# Patient Record
Sex: Female | Born: 1985 | Race: Black or African American | Hispanic: No | Marital: Single | Smoking: Never smoker
Health system: Southern US, Community
[De-identification: ages and names within clinical notes are randomized; demographics above are authoritative.]

## PROBLEM LIST (undated history)

## (undated) DIAGNOSIS — L80 Vitiligo: Secondary | ICD-10-CM

## (undated) DIAGNOSIS — R87619 Unspecified abnormal cytological findings in specimens from cervix uteri: Secondary | ICD-10-CM

## (undated) DIAGNOSIS — I10 Essential (primary) hypertension: Secondary | ICD-10-CM

## (undated) HISTORY — DX: Unspecified abnormal cytological findings in specimens from cervix uteri: R87.619

## (undated) HISTORY — DX: Essential (primary) hypertension: I10

---

## 2014-03-13 DIAGNOSIS — L80 Vitiligo: Secondary | ICD-10-CM | POA: Insufficient documentation

## 2016-02-16 DIAGNOSIS — I10 Essential (primary) hypertension: Secondary | ICD-10-CM | POA: Insufficient documentation

## 2016-06-19 ENCOUNTER — Emergency Department
Admission: EM | Admit: 2016-06-19 | Discharge: 2016-06-19 | Disposition: A | Discharge status: Home / Self Care | Payer: Medicaid Other | Attending: Student in an Organized Health Care Education/Training Program | Admitting: Student in an Organized Health Care Education/Training Program

## 2016-06-19 DIAGNOSIS — Y9389 Activity, other specified: Secondary | ICD-10-CM | POA: Insufficient documentation

## 2016-06-19 DIAGNOSIS — Y999 Unspecified external cause status: Secondary | ICD-10-CM | POA: Insufficient documentation

## 2016-06-19 DIAGNOSIS — M545 Low back pain, unspecified: Secondary | ICD-10-CM

## 2016-06-19 DIAGNOSIS — M546 Pain in thoracic spine: Secondary | ICD-10-CM | POA: Insufficient documentation

## 2016-06-19 DIAGNOSIS — Y92410 Unspecified street and highway as the place of occurrence of the external cause: Secondary | ICD-10-CM | POA: Insufficient documentation

## 2016-06-19 DIAGNOSIS — S3992XA Unspecified injury of lower back, initial encounter: Secondary | ICD-10-CM | POA: Diagnosis present

## 2016-06-19 MED ORDER — NAPROXEN 500 MG PO TABS: 500.0000 mg | ORAL_TABLET | Freq: Two times a day (BID) | ORAL | 0 refills | Status: DC

## 2016-06-19 MED ORDER — CYCLOBENZAPRINE HCL 10 MG PO TABS: 10.0000 mg | ORAL_TABLET | Freq: Three times a day (TID) | ORAL | 0 refills | Status: DC | PRN
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
## 2016-06-19 NOTE — Discharge Instructions (Signed)

## 2016-06-19 NOTE — ED Notes (Signed)

## 2016-10-01 ENCOUNTER — Emergency Department: Payer: Medicaid Other

## 2016-10-01 ENCOUNTER — Emergency Department
Admission: EM | Admit: 2016-10-01 | Discharge: 2016-10-01 | Disposition: A | Discharge status: Home / Self Care | Payer: Medicaid Other | Attending: Emergency Medicine | Admitting: Emergency Medicine

## 2016-10-01 ENCOUNTER — Encounter: Payer: Self-pay | Admitting: Emergency Medicine

## 2016-10-01 DIAGNOSIS — Y929 Unspecified place or not applicable: Secondary | ICD-10-CM | POA: Insufficient documentation

## 2016-10-01 DIAGNOSIS — S62663A Nondisplaced fracture of distal phalanx of left middle finger, initial encounter for closed fracture: Secondary | ICD-10-CM | POA: Diagnosis not present

## 2016-10-01 DIAGNOSIS — S6982XA Other specified injuries of left wrist, hand and finger(s), initial encounter: Secondary | ICD-10-CM | POA: Diagnosis present

## 2016-10-01 DIAGNOSIS — W182XXA Fall in (into) shower or empty bathtub, initial encounter: Secondary | ICD-10-CM | POA: Diagnosis not present

## 2016-10-01 DIAGNOSIS — Y939 Activity, unspecified: Secondary | ICD-10-CM | POA: Insufficient documentation

## 2016-10-01 DIAGNOSIS — S60222A Contusion of left hand, initial encounter: Secondary | ICD-10-CM

## 2016-10-01 DIAGNOSIS — R030 Elevated blood-pressure reading, without diagnosis of hypertension: Secondary | ICD-10-CM | POA: Diagnosis not present

## 2016-10-01 DIAGNOSIS — Y999 Unspecified external cause status: Secondary | ICD-10-CM | POA: Diagnosis not present

## 2016-10-01 MED ORDER — ACETAMINOPHEN 325 MG PO TABS
650.0000 mg | ORAL_TABLET | Freq: Once | ORAL | Status: AC
  Administered 2016-10-01: 650 mg via ORAL
  Filled 2016-10-01: qty 2

## 2016-10-01 MED ORDER — NAPROXEN 500 MG PO TBEC: 500.0000 mg | DELAYED_RELEASE_TABLET | Freq: Two times a day (BID) | ORAL | 0 refills | Status: DC

## 2016-10-01 MED ORDER — HYDROCHLOROTHIAZIDE 12.5 MG PO CAPS: 12.5000 mg | ORAL_CAPSULE | ORAL | 0 refills | Status: DC

## 2016-10-01 NOTE — ED Triage Notes (Signed)
Patient to ER for left hand pain after fall in shower yesterday. Patient's hand swollen, no obvious deformity.

## 2016-10-01 NOTE — ED Provider Notes (Signed)
[facility] Emergency Department Provider Note ____________________________________________  Time seen: 0739  I have reviewed the triage vital signs and the nursing notes.  HISTORY  Chief Complaint  Hand Injury  History collected by J. Irean Hong, PA-S [place])  HPI Leslie Jones is a 31 y.o. female presents to the ED for evaluation of left hand and middle finger pain s/p a mechanical fall in the shower yesterday. She recalls falling backwards with an outstretched hand. She hit her hand and fingers on a metal grab bar. She presents now with pain and swelling to the distal middle fingertip, dorsal hand, and tenderness over the index finger. She denies head injury, LOC, or laceration. She has not attempted any alleviating measures prior to arrival. She reports a current tetanus.   History reviewed. No pertinent past medical history.  There are no active problems to display for this patient.   Past Surgical History:  Procedure Laterality Date  . CESAREAN SECTION      Prior to Admission medications   Medication Sig Start Date End Date Taking? Authorizing Provider  cyclobenzaprine (FLEXERIL) 10 MG tablet Take 1 tablet (10 mg total) by mouth 3 (three) times daily as needed for muscle spasms. 06/19/16   Willy Eddy, MD  hydrochlorothiazide (MICROZIDE) 12.5 MG capsule Take 1 capsule (12.5 mg total) by mouth every morning. 10/01/16 10/31/16  Atanacio Melnyk, Charlesetta Ivory, PA-C  naproxen (EC NAPROSYN) 500 MG EC tablet Take 1 tablet (500 mg total) by mouth 2 (two) times daily with a meal. 10/01/16   Edelmira Gallogly, Charlesetta Ivory, PA-C    Allergies Patient has no known allergies.  No family history on file.  Social History Social History  Substance Use Topics  . Smoking status: Never Smoker  . Smokeless tobacco: Never Used  . Alcohol use No    Review of Systems  Constitutional: Negative for fever. Cardiovascular: Negative for chest pain. Respiratory: Negative for  shortness of breath. Musculoskeletal: Negative for back pain. Left hand pain as above. Skin: Negative for rash. Neurological: Negative for headaches, focal weakness or numbness. ____________________________________________  PHYSICAL EXAM:  VITAL SIGNS: ED Triage Vitals [10/01/16 0729]  Enc Vitals Group     BP (!) 153/100     Pulse Rate 85     Resp 16     Temp 98.6 F (37 C)     Temp Source Oral     SpO2 97 %     Weight 160 lb (72.6 kg)     Height 5\' 2"  (1.575 m)     Head Circumference      Peak Flow      Pain Score 8     Pain Loc      Pain Edu?      Excl. in GC?     Constitutional: Alert and oriented. Well appearing and in no distress. Head: Normocephalic and atraumatic. Cardiovascular: Normal rate, regular rhythm. Normal distal pulses. Respiratory: Normal respiratory effort. No wheezes/rales/rhonchi. Musculoskeletal: Left hand with obvious dorsal STS. Left middle finger with a small subungual hematoma noted at the cuticle. Some subtle edema of the middle finger distal phalanx. Normal composite fist. Nontender with normal range of motion in all extremities.  Neurologic:  Normal gross sensation. Normal intrinsic & opposition testing. CN II-XII grossly intact. Normal speech and language. No gross focal neurologic deficits are appreciated. Skin:  Skin is warm, dry and intact. No rash noted. Scattered superficial abrasions to the dorsal left hand. ____________________________________________   RADIOLOGY  Left Hand IMPRESSION: Third  distal tuft fracture.  I, Leonid Manus, Charlesetta IvoryJenise V Bacon, personally viewed and evaluated these images (plain radiographs) as part of my medical decision making, as well as reviewing the written report by the radiologist. ____________________________________________  PROCEDURES  Finger splint - middle finger Buddy tape Tylenol 650 mg PO ____________________________________________  INITIAL IMPRESSION / ASSESSMENT AND PLAN / ED COURSE  Patient  with initial fracture management of a left hand contusion resulting in a closed, distal tuft fracture of the distal phalanx of the middle finger. Patient is appropriately splinted and given fracture care instructions. She will be referred to ortho-hand for further care. Work activities limited by finger splint. She should also see her PCP or a local community clinic for further management of her elevated blood pressures. I will start her on a low-dose HCTZ ahead of her July 2nd [facility] visit.  ____________________________________________  FINAL CLINICAL IMPRESSION(S) / ED DIAGNOSES  Final diagnoses:  Contusion of left hand, initial encounter  Nondisplaced fracture of distal phalanx of left middle finger, initial encounter for closed fracture  Elevated BP without diagnosis of hypertension     Myrah Strawderman, Charlesetta IvoryJenise V Bacon, PA-C 10/01/16 0839    Lissa HoardMenshew, Rodell Marrs V Bacon, PA-C 10/01/16 [id number]    Jeanmarie PlantMcShane, James A, MD 10/01/16 1529

## 2016-10-01 NOTE — Discharge Instructions (Signed)
You are being treated for a fracture (break) of the distal finger tip bone. You also have some swelling from your injury. Wear the finger splint for protection. Take the Naproxen for pain and inflammation. Apply ice to reduce swelling. Follow-up with ortho for continued symptoms. Monitor and track your blood pressures weekly, because you may need treatment for hypertension. Avoid high sodium (salty) foods, processed meats, and fast food. Drink plenty of water and green tea.

## 2016-10-01 NOTE — ED Notes (Signed)
Patient presents to the ED with left hand pain post falling in the shower at 4:30pm yesterday.  Patient's middle finger appears bruised.  Patient is not wanting to move her left hand much but sensation is intact.

## 2016-12-17 DIAGNOSIS — R300 Dysuria: Secondary | ICD-10-CM | POA: Diagnosis present

## 2016-12-17 DIAGNOSIS — Z79899 Other long term (current) drug therapy: Secondary | ICD-10-CM | POA: Insufficient documentation

## 2016-12-17 DIAGNOSIS — R6883 Chills (without fever): Secondary | ICD-10-CM | POA: Insufficient documentation

## 2016-12-17 DIAGNOSIS — N12 Tubulo-interstitial nephritis, not specified as acute or chronic: Secondary | ICD-10-CM | POA: Diagnosis not present

## 2016-12-17 LAB — COMPREHENSIVE METABOLIC PANEL
ALT: 29 U/L (ref 14–54)
AST: 40 U/L (ref 15–41)
Albumin: 3.9 g/dL (ref 3.5–5.0)
Alkaline Phosphatase: 68 U/L (ref 38–126)
Anion gap: 6 (ref 5–15)
BILIRUBIN TOTAL: 1.4 mg/dL — AB (ref 0.3–1.2)
BUN: 12 mg/dL (ref 6–20)
CHLORIDE: 107 mmol/L (ref 101–111)
CO2: 25 mmol/L (ref 22–32)
CREATININE: 1.1 mg/dL — AB (ref 0.44–1.00)
Calcium: 9.1 mg/dL (ref 8.9–10.3)
Glucose, Bld: 137 mg/dL — ABNORMAL HIGH (ref 65–99)
Potassium: 3.5 mmol/L (ref 3.5–5.1)
Sodium: 138 mmol/L (ref 135–145)
TOTAL PROTEIN: 7.4 g/dL (ref 6.5–8.1)

## 2016-12-17 LAB — CBC WITH DIFFERENTIAL/PLATELET
BASOS ABS: 0 10*3/uL (ref 0–0.1)
Basophils Relative: 0 %
EOS PCT: 1 %
Eosinophils Absolute: 0 10*3/uL (ref 0–0.7)
HEMATOCRIT: 37 % (ref 35.0–47.0)
Hemoglobin: 13.3 g/dL (ref 12.0–16.0)
LYMPHS ABS: 0.8 10*3/uL — AB (ref 1.0–3.6)
LYMPHS PCT: 15 %
MCH: 32.1 pg (ref 26.0–34.0)
MCHC: 35.9 g/dL (ref 32.0–36.0)
MCV: 89.4 fL (ref 80.0–100.0)
MONO ABS: 0.2 10*3/uL (ref 0.2–0.9)
Monocytes Relative: 4 %
NEUTROS ABS: 4.4 10*3/uL (ref 1.4–6.5)
Neutrophils Relative %: 80 %
PLATELETS: 194 10*3/uL (ref 150–440)
RBC: 4.14 MIL/uL (ref 3.80–5.20)
RDW: 12.2 % (ref 11.5–14.5)
WBC: 5.5 10*3/uL (ref 3.6–11.0)

## 2016-12-17 LAB — URINALYSIS, ROUTINE W REFLEX MICROSCOPIC
Bilirubin Urine: NEGATIVE
GLUCOSE, UA: NEGATIVE mg/dL
Ketones, ur: NEGATIVE mg/dL
NITRITE: NEGATIVE
PH: 6 (ref 5.0–8.0)
Protein, ur: 30 mg/dL — AB
SPECIFIC GRAVITY, URINE: 1.017 (ref 1.005–1.030)

## 2016-12-17 NOTE — ED Triage Notes (Signed)
Patient reports last week with dysuria.  Now with generalized weakness and chills.

## 2016-12-17 NOTE — ED Notes (Signed)
POCT RESULTS were *(NEGATIVE)*. Urine sample has been sent to the lab

## 2016-12-18 ENCOUNTER — Emergency Department
Admission: EM | Admit: 2016-12-18 | Discharge: 2016-12-18 | Disposition: A | Discharge status: Home / Self Care | Payer: Medicaid Other | Attending: Emergency Medicine | Admitting: Emergency Medicine

## 2016-12-18 DIAGNOSIS — N12 Tubulo-interstitial nephritis, not specified as acute or chronic: Secondary | ICD-10-CM

## 2016-12-18 MED ORDER — CIPROFLOXACIN HCL 500 MG PO TABS: 500.0000 mg | ORAL_TABLET | Freq: Two times a day (BID) | ORAL | 0 refills | Status: AC

## 2016-12-18 MED ORDER — CIPROFLOXACIN HCL 500 MG PO TABS
500.0000 mg | ORAL_TABLET | Freq: Once | ORAL | Status: AC
  Administered 2016-12-18: 500 mg via ORAL
  Filled 2016-12-18: qty 1

## 2016-12-18 MED ORDER — IBUPROFEN 600 MG PO TABS
600.0000 mg | ORAL_TABLET | Freq: Once | ORAL | Status: AC
  Administered 2016-12-18: 600 mg via ORAL
  Filled 2016-12-18: qty 1

## 2016-12-18 NOTE — ED Provider Notes (Signed)
[facility] Emergency Department Provider Note _   First MD Initiated Contact with Patient 12/18/16 0126     (approximate)  I have reviewed the triage vital signs and the nursing notes.   HISTORY  Chief Complaint Chills and Dysuria   HPI Leslie Jones is a 31 y.o. female presents to the emergency department with one-week history of dysuria with onset of generalized weakness chills and 8 out of 10 left side back pain tonight. Patient denies any nausea or vomiting. Patient denies any abdominal pain. Patient denies any hematuria. Patient denies any fever noted to have an oral  temperature 100.1 on arrival to the emergency part.   Past medical history None There are no active problems to display for this patient.   Past Surgical History:  Procedure Laterality Date  . CESAREAN SECTION      Prior to Admission medications   Medication Sig Start Date End Date Taking? Authorizing Provider  cyclobenzaprine (FLEXERIL) 10 MG tablet Take 1 tablet (10 mg total) by mouth 3 (three) times daily as needed for muscle spasms. 06/19/16   Willy Eddy, MD  hydrochlorothiazide (MICROZIDE) 12.5 MG capsule Take 1 capsule (12.5 mg total) by mouth every morning. 10/01/16 10/31/16  Menshew, Charlesetta Ivory, PA-C  naproxen (EC NAPROSYN) 500 MG EC tablet Take 1 tablet (500 mg total) by mouth 2 (two) times daily with a meal. 10/01/16   Menshew, Charlesetta Ivory, PA-C    Allergies No known drug allergies No family history on file.  Social History Social History  Substance Use Topics  . Smoking status: Never Smoker  . Smokeless tobacco: Never Used  . Alcohol use No    Review of Systems Constitutional: No fever/chills Eyes: No visual changes. ENT: No sore throat. Cardiovascular: Denies chest pain. Respiratory: Denies shortness of breath. Gastrointestinal: No abdominal pain.  No nausea, no vomiting.  No diarrhea.  No constipation. Genitourinary: Negative for  dysuria. Musculoskeletal: Negative for neck pain.  Negative for back pain. Integumentary: Negative for rash. Neurological: Negative for headaches, focal weakness or numbness.   ____________________________________________   PHYSICAL EXAM:  VITAL SIGNS: ED Triage Vitals  Enc Vitals Group     BP 12/17/16 2228 (!) 175/103     Pulse Rate 12/17/16 2228 98     Resp 12/17/16 2228 20     Temp 12/17/16 2228 100.1 F (37.8 C)     Temp Source 12/17/16 2228 Oral     SpO2 12/17/16 2228 99 %     Weight 12/17/16 2228 72.6 kg (160 lb)     Height 12/17/16 2228 1.575 m ( )     Head Circumference --      Peak Flow --      Pain Score 12/18/16 0122 8     Pain Loc --      Pain Edu? --      Excl. in GC? --     Constitutional: Alert and oriented. Well appearing and in no acute distress. Eyes: Conjunctivae are normal.  Head: Atraumatic. Mouth/Throat: Mucous membranes are moist.  Oropharynx non-erythematous. Neck: No stridor.   Cardiovascular: Normal rate, regular rhythm. Good peripheral circulation. Grossly normal heart sounds. Respiratory: Normal respiratory effort.  No retractions. Lungs CTAB. Gastrointestinal: Soft and nontender. No distention. LEFT cva TENDERNESS Musculoskeletal: No lower extremity tenderness nor edema. No gross deformities of extremities. Neurologic:  Normal speech and language. No gross focal neurologic deficits are appreciated.  Skin:  Skin is warm, dry and intact. No rash noted.  Psychiatric: Mood and affect are normal. Speech and behavior are normal.  ____________________________________________   LABS (all labs ordered are listed, but only abnormal results are displayed)  Labs Reviewed  CBC WITH DIFFERENTIAL/PLATELET - Abnormal; Notable for the following:       Result Value   Lymphs Abs 0.8 (*)    All other components within normal limits  COMPREHENSIVE METABOLIC PANEL - Abnormal; Notable for the following:    Glucose, Bld 137 (*)    Creatinine, Ser 1.10  (*)    Total Bilirubin 1.4 (*)    All other components within normal limits  URINALYSIS, ROUTINE W REFLEX MICROSCOPIC - Abnormal; Notable for the following:    Color, Urine YELLOW (*)    APPearance CLOUDY (*)    Hgb urine dipstick MODERATE (*)    Protein, ur 30 (*)    Leukocytes, UA LARGE (*)    Bacteria, UA RARE (*)    Squamous Epithelial / LPF 0-5 (*)    All other components within normal limits  POC URINE PREG, ED     Procedures   ____________________________________________   INITIAL IMPRESSION / ASSESSMENT AND PLAN / ED COURSE  Pertinent labs & imaging results that were available during my care of the patient were reviewed by me and considered in my medical decision making (see chart for details).  31-YEAR-OLD FEMALE PRESENTING WITH HISTORY OF PHYSICAL EXAM CONSISTENT WITH A ACUTE PYELONEPHRITIS. PATIENT GIVEN CIPRO IN THE EMERGENCY DEPARTMENT WILL BE PRESCRIBED THE SAME FOR HOME. DISCUSSED WITH THE PATIENT AT LENGTH WARNING SIGNS THAT WOULD WARRANT RETURN TO THE EMERGENCY DEPARTMENT.   Clinical Course as of Dec 19 155  Wynelle LinkSun Dec 18, 2016  0105 Glucose: NEGATIVE [RB]    Clinical Course User Index [RB] Darci CurrentBrown, Canfield N, MD    ____________________________________________  FINAL CLINICAL IMPRESSION(S) / ED DIAGNOSES  Final diagnoses:  Pyelonephritis     MEDICATIONS GIVEN DURING THIS VISIT:  Medications  ciprofloxacin (CIPRO) tablet 500 mg (500 mg Oral Given 12/18/16 0143)  ibuprofen (ADVIL,MOTRIN) tablet 600 mg (600 mg Oral Given 12/18/16 0143)     NEW OUTPATIENT MEDICATIONS STARTED DURING THIS VISIT:  New Prescriptions   No medications on file    Modified Medications   No medications on file    Discontinued Medications   No medications on file     Note:  This document was prepared using Dragon voice recognition software and may include unintentional dictation errors.    Darci CurrentBrown,  N, MD 12/18/16 [phone]

## 2016-12-18 NOTE — ED Notes (Signed)
Dr. Brown at bedside

## 2016-12-18 NOTE — ED Notes (Signed)
Pt reports aching all over since last Sunday; sharp pain in vaginal area when voiding but no burning; urinary frequency in small amounts; chills at times, has not checked temp at home; cramping across lower abd and across lower back;

## 2017-09-05 LAB — HM PAP SMEAR: HM Pap smear: POSITIVE

## 2017-09-26 ENCOUNTER — Ambulatory Visit (INDEPENDENT_AMBULATORY_CARE_PROVIDER_SITE_OTHER): Payer: Medicaid Other | Admitting: Obstetrics and Gynecology

## 2017-09-26 ENCOUNTER — Encounter: Payer: Self-pay | Admitting: Obstetrics and Gynecology

## 2017-09-26 VITALS — BP 162/94 | HR 87 | Ht 62.0 in | Wt 182.8 lb

## 2017-09-26 DIAGNOSIS — B977 Papillomavirus as the cause of diseases classified elsewhere: Secondary | ICD-10-CM

## 2017-09-26 DIAGNOSIS — R87612 Low grade squamous intraepithelial lesion on cytologic smear of cervix (LGSIL): Secondary | ICD-10-CM

## 2017-09-26 DIAGNOSIS — N72 Inflammatory disease of cervix uteri: Secondary | ICD-10-CM

## 2017-09-26 NOTE — Addendum Note (Signed)
Addended by: Marchelle FolksMILLER, Jobeth Pangilinan G on: 09/26/2017 03:39 PM   Modules accepted: Orders

## 2017-09-26 NOTE — Progress Notes (Signed)
HPI:  Leslie Jones is a 32 y.o.  [phone]  who presents today for evaluation and management of abnormal cervical cytology.  This is her first abnormal Pap smear  Dysplasia History: LSIL    positive high risk viral types  ROS:  Pertinent items are noted in HPI.  OB History  Gravida Para Term Preterm AB Living  3 3 3     3   SAB TAB Ectopic Multiple Live Births          3    # Outcome Date GA Lbr Len/2nd Weight Sex Delivery Anes PTL Lv  3 Term 2015   6 lb 1.9 oz (2.776 kg) M Vag-Spont   LIV  2 Term 2012   7 lb 2.4 oz (3.243 kg) F Vag-Spont   LIV  1 Term 2009   7 lb 2.4 oz (3.243 kg) M CS-LTranv   LIV    Past Medical History:  Diagnosis Date  . Abnormal Pap smear of cervix   . Hypertension     Past Surgical History:  Procedure Laterality Date  . CESAREAN SECTION      SOCIAL HISTORY: Social History   Substance and Sexual Activity  Alcohol Use No   Social History   Substance and Sexual Activity  Drug Use No     Family History  Problem Relation Age of Onset  . Heart failure Maternal Aunt   . Breast cancer Maternal Grandmother   . Ovarian cancer Neg Hx   . Colon cancer Neg Hx     ALLERGIES:  Patient has no known allergies.  She has a current medication list which includes the following prescription(s): medroxyprogesterone.  Physical Exam: -Vitals:  BP (!) 162/94   Pulse 87   Ht 5\' 2"  (1.575 m)   Wt 182 lb 12.8 oz (82.9 kg)   LMP  (LMP Unknown)   BMI 33.43 kg/m  GEN: WD, WN, NAD.  A+ O x 3, good mood and affect. ABD:  NT, ND.  Soft, no masses.  No hernias noted.   Pelvic:   Vulva: Normal appearance.  No lesions.  Vagina: No lesions or abnormalities noted.  Support: Normal pelvic support.  Urethra No masses tenderness or scarring.  Meatus Normal size without lesions or prolapse.  Cervix: See below.  Anus: Normal exam.  No lesions.  Perineum: Normal exam.  No lesions.        Bimanual   Uterus: Normal size.  Non-tender.  Mobile.  AV.  Adnexae: No  masses.  Non-tender to palpation.  Cul-de-sac: Negative for abnormality.   PROCEDURE: 1.  Urine Pregnancy Test:  negative 2.  Colposcopy performed with 4% acetic acid after verbal consent obtained                           -Aceto-white Lesions Location(s): 4 o'clock.              -Biopsy performed at 4 o'clock               -ECC indicated and performed: No.     -Biopsy sites made hemostatic with pressure and Monsel's solution   -Satisfactory colposcopy: Yes.      -Evidence of Invasive cervical CA :  NO  ASSESSMENT:  Leslie SpraySheara Budnick is a 32 y.o. [id number] here for  1. Low grade squamous intraepithelial lesion on cytologic smear of cervix (LGSIL)   2. High risk human papilloma virus (HPV) infection of cervix   .  PLAN: 1.  I discussed the grading system of pap smears and HPV high risk viral types.  We will discuss management after colpo results return.  No orders of the defined types were placed in this encounter.          F/U  No follow-ups on file.  Brennan Bailey ,MD 09/26/2017,2:32 PM

## 2017-09-28 LAB — PATHOLOGY

## 2017-10-03 ENCOUNTER — Ambulatory Visit (INDEPENDENT_AMBULATORY_CARE_PROVIDER_SITE_OTHER): Payer: Medicaid Other | Admitting: Obstetrics and Gynecology

## 2017-10-03 ENCOUNTER — Encounter: Payer: Self-pay | Admitting: Obstetrics and Gynecology

## 2017-10-03 VITALS — BP 142/94 | HR 75 | Ht 62.0 in | Wt 181.7 lb

## 2017-10-03 DIAGNOSIS — N72 Inflammatory disease of cervix uteri: Secondary | ICD-10-CM | POA: Diagnosis not present

## 2017-10-03 DIAGNOSIS — B977 Papillomavirus as the cause of diseases classified elsewhere: Secondary | ICD-10-CM | POA: Diagnosis not present

## 2017-10-03 DIAGNOSIS — R87612 Low grade squamous intraepithelial lesion on cytologic smear of cervix (LGSIL): Secondary | ICD-10-CM | POA: Diagnosis not present

## 2017-10-03 NOTE — Progress Notes (Signed)
Pt present today for colpo results. Pt stated that she is doing well.

## 2017-10-03 NOTE — Progress Notes (Signed)
HPI:      Ms. Katianne Barre is a 32 y.o. [phone] who LMP was No LMP recorded (lmp unknown). Patient has had an injection.  Subjective:   She presents today for follow-up colposcopy.    Hx: The following portions of the patient's history were reviewed and updated as appropriate:             She  has a past medical history of Abnormal Pap smear of cervix and Hypertension. She does not have a problem list on file. She  has a past surgical history that includes Cesarean section. Her family history includes Breast cancer in her maternal grandmother; Heart failure in her maternal aunt; Seizures in her mother. She  reports that she has never smoked. She has never used smokeless tobacco. She reports that she does not drink alcohol or use drugs. She has a current medication list which includes the following prescription(s): medroxyprogesterone. She has No Known Allergies.       Review of Systems:  Review of Systems  Constitutional: Denied constitutional symptoms, night sweats, recent illness, fatigue, fever, insomnia and weight loss.  Eyes: Denied eye symptoms, eye pain, photophobia, vision change and visual disturbance.  Ears/Nose/Throat/Neck: Denied ear, nose, throat or neck symptoms, hearing loss, nasal discharge, sinus congestion and sore throat.  Cardiovascular: Denied cardiovascular symptoms, arrhythmia, chest pain/pressure, edema, exercise intolerance, orthopnea and palpitations.  Respiratory: Denied pulmonary symptoms, asthma, pleuritic pain, productive sputum, cough, dyspnea and wheezing.  Gastrointestinal: Denied, gastro-esophageal reflux, melena, nausea and vomiting.  Genitourinary: Denied genitourinary symptoms including symptomatic vaginal discharge, pelvic relaxation issues, and urinary complaints.  Musculoskeletal: Denied musculoskeletal symptoms, stiffness, swelling, muscle weakness and myalgia.  Dermatologic: Denied dermatology symptoms, rash and scar.  Neurologic: Denied  neurology symptoms, dizziness, headache, neck pain and syncope.  Psychiatric: Denied psychiatric symptoms, anxiety and depression.  Endocrine: Denied endocrine symptoms including hot flashes and night sweats.   Meds:   Current Outpatient Medications on File Prior to Visit  Medication Sig Dispense Refill  . medroxyPROGESTERone (DEPO-SUBQ PROVERA 104) 104 MG/0.65ML injection Inject into the skin.     No current facility-administered medications on file prior to visit.     Objective:     Vitals:   10/03/17 1513  BP: (!) 142/94  Pulse: 75                Assessment:    [id number] There are no active problems to display for this patient.    1. Low grade squamous intraepithelial lesion on cytologic smear of cervix (LGSIL)   2. High risk human papilloma virus (HPV) infection of cervix     Colposcopy results revealed normal cytology.   Plan:            1.  I discussed normal cytology and abnormal Pap smears in detail with the patient.  HPV was also again reviewed.  I have recommended a follow-up Pap smear in 1 year.  Based on this Pap would consider colposcopy as needed.  Importance of follow-up and HPV discussed in detail. Orders No orders of the defined types were placed in this encounter.   No orders of the defined types were placed in this encounter.     F/U  Return in about 1 year (around 10/04/2018). I spent 16 minutes involved in the care of this patient of which greater than 50% was spent discussing abnormal colposcopy, normal colposcopy, abnormal Pap smears, cytology versus HPV, expected follow-up and consequences of future abnormal Pap smears if they occur.  All questions answered.  Elonda Huskyavid J. Evans, M.D. 10/03/2017 5:28 PM

## 2017-12-12 ENCOUNTER — Emergency Department
Admission: EM | Admit: 2017-12-12 | Discharge: 2017-12-12 | Disposition: A | Discharge status: Home / Self Care | Payer: Medicaid Other | Attending: Emergency Medicine | Admitting: Emergency Medicine

## 2017-12-12 ENCOUNTER — Other Ambulatory Visit: Payer: Self-pay

## 2017-12-12 DIAGNOSIS — Z79899 Other long term (current) drug therapy: Secondary | ICD-10-CM | POA: Diagnosis not present

## 2017-12-12 DIAGNOSIS — G43909 Migraine, unspecified, not intractable, without status migrainosus: Secondary | ICD-10-CM | POA: Diagnosis not present

## 2017-12-12 DIAGNOSIS — I10 Essential (primary) hypertension: Secondary | ICD-10-CM | POA: Diagnosis not present

## 2017-12-12 MED ORDER — BUTALBITAL-APAP-CAFFEINE 50-325-40 MG PO TABS: 1.0000 | ORAL_TABLET | Freq: Four times a day (QID) | ORAL | 0 refills | Status: AC | PRN

## 2017-12-12 MED ORDER — KETOROLAC TROMETHAMINE 30 MG/ML IJ SOLN
30.0000 mg | Freq: Once | INTRAMUSCULAR | Status: AC
  Administered 2017-12-12: 30 mg via INTRAMUSCULAR
  Filled 2017-12-12: qty 1

## 2017-12-12 NOTE — ED Notes (Signed)
ED Provider at bedside. 

## 2017-12-12 NOTE — ED Notes (Signed)
Pt verbalized understanding of discharge instructions. NAD at this time. 

## 2017-12-12 NOTE — ED Notes (Signed)
Pt resting quietly on stretcher reports migraine HA x 2 days with photosensitivity and nausea.  Lights dimmed and pt given warm blanket.

## 2017-12-12 NOTE — ED Provider Notes (Signed)
[facility] Emergency Department Provider Note   ____________________________________________    I have reviewed the triage vital signs and the nursing notes.   HISTORY  Chief Complaint Migraine     HPI Leslie Jones is a 32 y.o. female who presents with complaints of a migraine headache.  Patient reports she has had a symptoms over the last 2 days.  She describes a throbbing pain behind her left eye.  She reports this is typical of her migraines.  She was taking BC powder and this was helping somewhat but she was having to take more than typical.  Denies neuro deficits.  No back pain neck pain fevers or chills.  No nausea or vomiting.  Decreased p.o. appetite.  On Depo-Provera   Past Medical History:  Diagnosis Date  . Abnormal Pap smear of cervix   . Hypertension     There are no active problems to display for this patient.   Past Surgical History:  Procedure Laterality Date  . CESAREAN SECTION      Prior to Admission medications   Medication Sig Start Date End Date Taking? Authorizing Provider  butalbital-acetaminophen-caffeine (FIORICET, ESGIC) 50-325-40 MG tablet Take 1-2 tablets by mouth every 6 (six) hours as needed for headache. 12/12/17 12/12/18  Jene Every, MD  medroxyPROGESTERone (DEPO-SUBQ PROVERA 104) 104 MG/0.65ML injection Inject into the skin.    [provider]     Allergies Patient has no known allergies.  Family History  Problem Relation Age of Onset  . Heart failure Maternal Aunt   . Breast cancer Maternal Grandmother   . Seizures Mother   . Ovarian cancer Neg Hx   . Colon cancer Neg Hx     Social History Social History   Tobacco Use  . Smoking status: Never Smoker  . Smokeless tobacco: Never Used  Substance Use Topics  . Alcohol use: No  . Drug use: No    Review of Systems  Constitutional: No fever/chills Eyes: No changes in vision ENT: No neck pain Cardiovascular: No  palpitation Respiratory: No cough Gastrointestinal:   No nausea, no vomiting.   Genitourinary: Negative for incontinence Musculoskeletal: Negative for neck pain Skin: Negative for rash. Neurological: Headache as above, no weakness or paresthesias   ____________________________________________   PHYSICAL EXAM:  VITAL SIGNS: ED Triage Vitals  Enc Vitals Group     BP 12/12/17 0814 (!) 175/109     Pulse Rate 12/12/17 0814 87     Resp 12/12/17 0814 18     Temp 12/12/17 0814 98.3 F (36.8 C)     Temp Source 12/12/17 0814 Oral     SpO2 12/12/17 0814 100 %     Weight 12/12/17 0815 81.6 kg (180 lb)     Height 12/12/17 0815 1.575 m (5\' 2" )     Head Circumference --      Peak Flow --      Pain Score 12/12/17 0815 7     Pain Loc --      Pain Edu? --      Excl. in GC? --     Constitutional: Alert and oriented.  Eyes: Conjunctivae are normal.  PERRLA, EOMI Head: Atraumatic. Mouth/Throat: Mucous membranes are moist.   Cardiovascular: Normal rate, regular rhythm. Good peripheral circulation. Respiratory: Normal respiratory effort.  No retractions. Gastrointestinal: Soft and nontender. No distention.  No CVA tenderness. Genitourinary: deferred Musculoskeletal: No lower extremity tenderness nor edema.  Warm and well perfused Neurologic:  Normal speech and language. No  gross focal neurologic deficits are appreciated.  Renal nerves II through XII are normal Skin:  Skin is warm, dry and intact. No rash noted. Psychiatric: Mood and affect are normal. Speech and behavior are normal.  ____________________________________________   LABS (all labs ordered are listed, but only abnormal results are displayed)  Labs Reviewed - No data to display ____________________________________________  EKG  None ____________________________________________  RADIOLOGY  None ____________________________________________   PROCEDURES  Procedure(s) performed: No  Procedures   Critical Care  performed: No ____________________________________________   INITIAL IMPRESSION / ASSESSMENT AND PLAN / ED COURSE  Pertinent labs & imaging results that were available during my care of the patient were reviewed by me and considered in my medical decision making (see chart for details).  Patient well-appearing in no acute distress.  Blood pressure is elevated, she does have a history of high blood pressure.  Discussed treatment options with patient, she would prefer an IM injection and prescription so that she can go home and rest in her own bed.  I think this is quite reasonable, I did offer IV medications but she declined    ____________________________________________   FINAL CLINICAL IMPRESSION(S) / ED DIAGNOSES  Final diagnoses:  Migraine without status migrainosus, not intractable, unspecified migraine type        Note:  This document was prepared using Dragon voice recognition software and may include unintentional dictation errors.    Jene Every, MD 12/12/17 1026

## 2017-12-12 NOTE — ED Triage Notes (Signed)
Pt c/o migraine for the past 2 days with nausea. Pt is hypertensive in triage with a hx , states she does not take anything for HTN.Leslie Jones

## 2018-02-13 IMAGING — DX DG HAND COMPLETE 3+V*L*
3 series · 3 of 3 positions shown · non-contrast
Comparison: None.

CLINICAL DATA: Fall in shower yesterday with middle finger injury
as well as second/ third metacarpal injury.

EXAM:
LEFT HAND - COMPLETE 3+ VIEW

[hand ap]
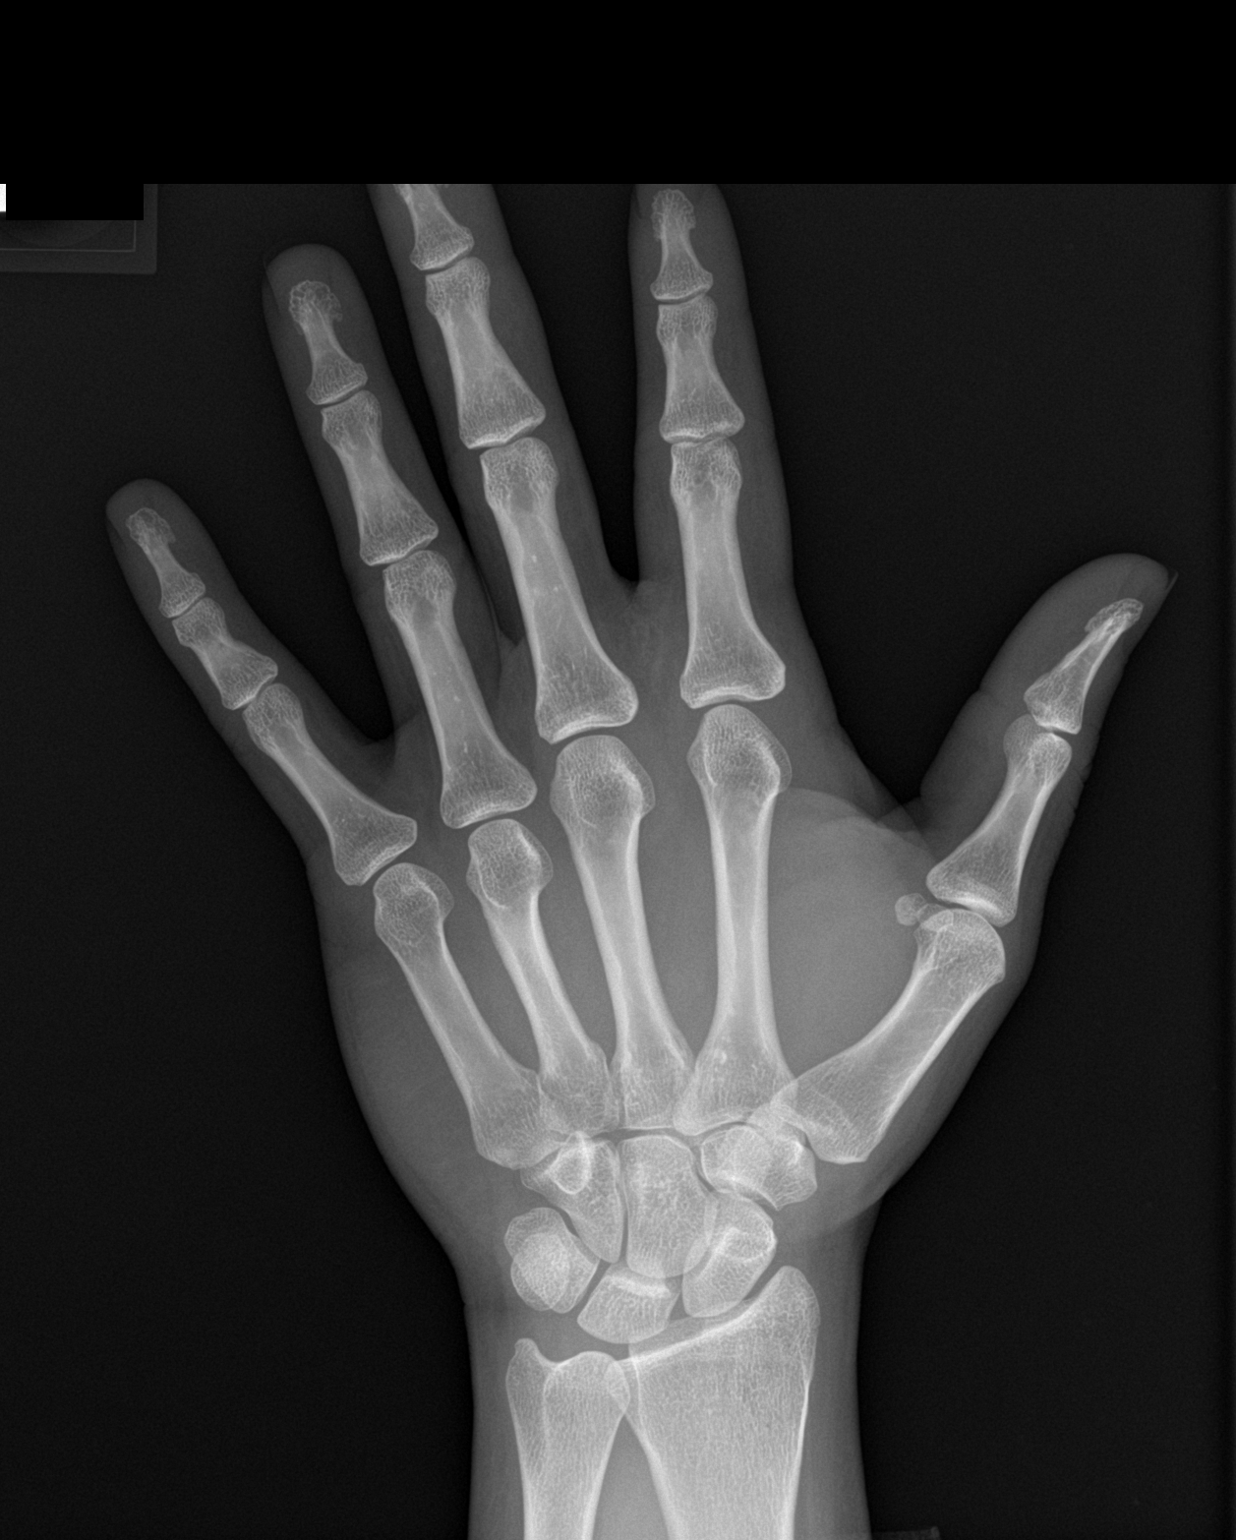

[hand obl]
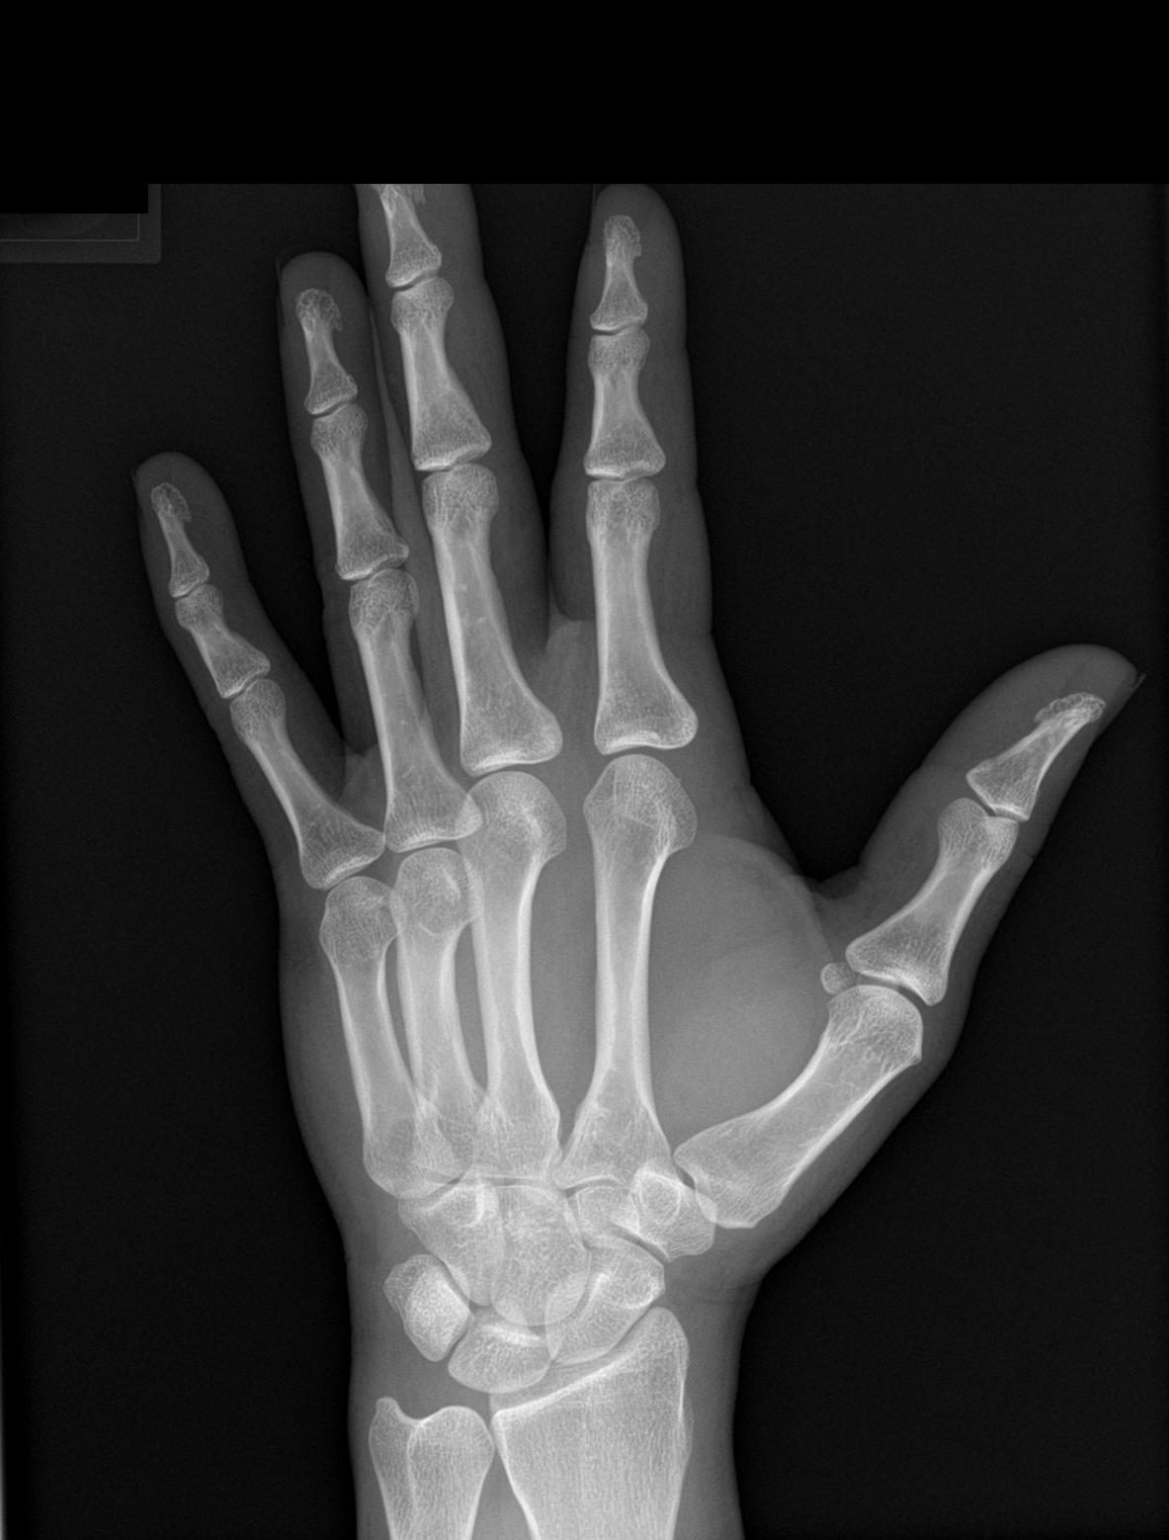

[hand lat]
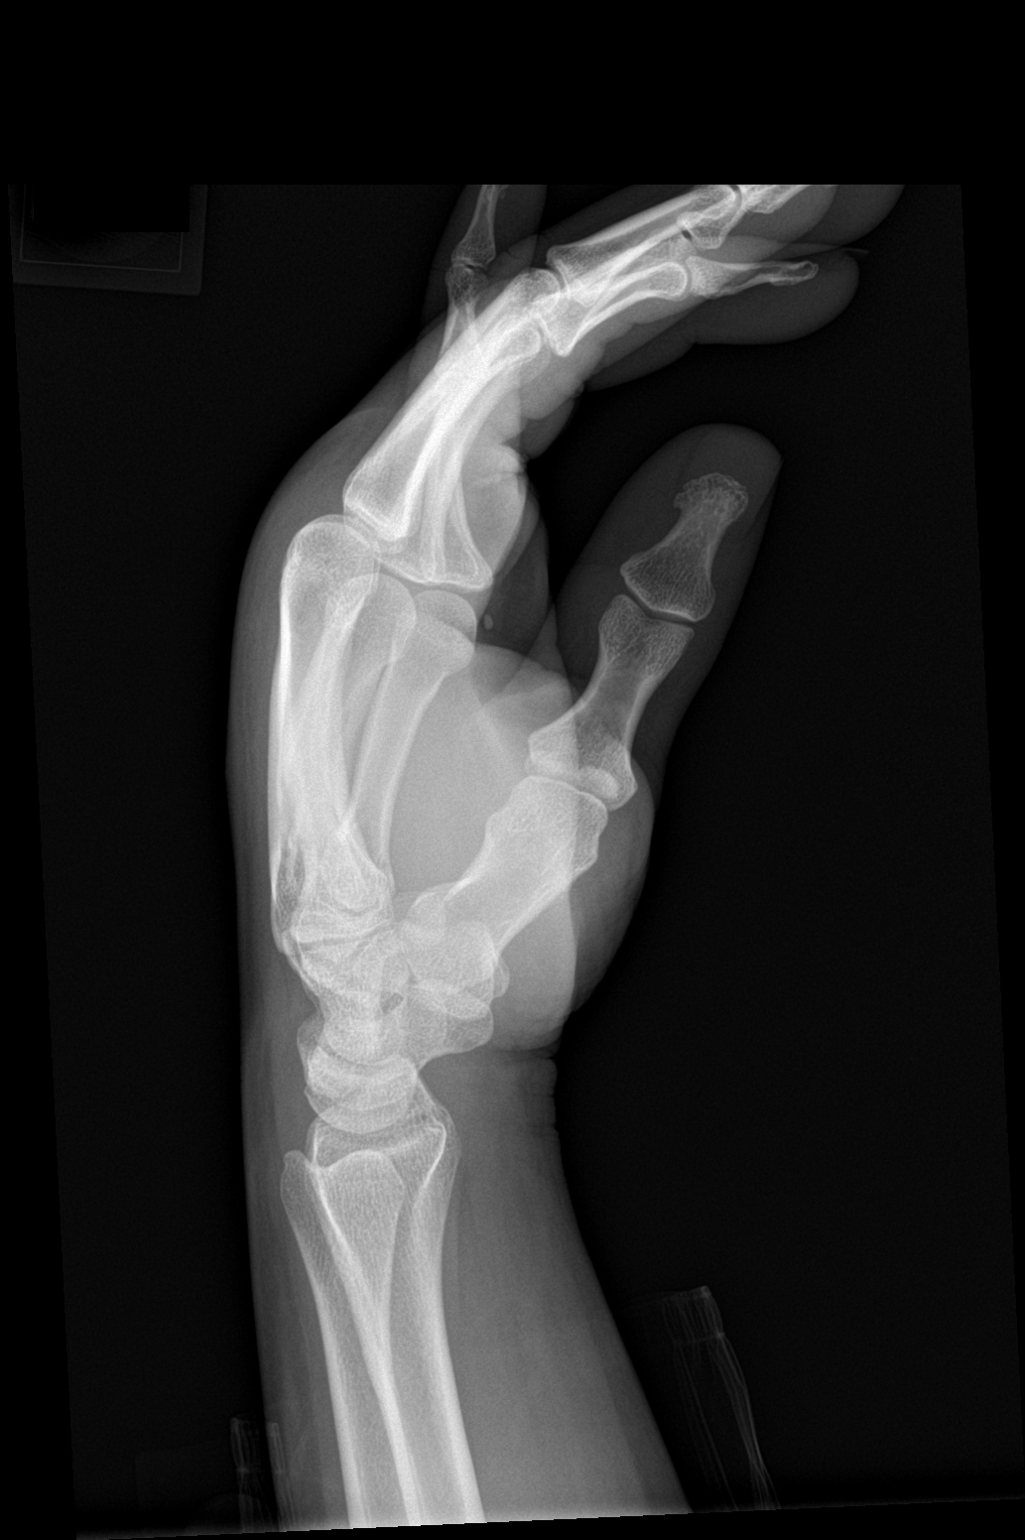

[3 of 3 positions shown; findings below may reference images not displayed]

FINDINGS: Examination demonstrates a fracture involving the distal tuft of the
third finger. Remainder of the exam is within normal.
IMPRESSION: Third distal tuft fracture.

## 2018-06-28 ENCOUNTER — Emergency Department
Admission: EM | Admit: 2018-06-28 | Discharge: 2018-06-28 | Disposition: A | Discharge status: Home / Self Care | Payer: Medicaid Other | Attending: Emergency Medicine | Admitting: Emergency Medicine

## 2018-06-28 ENCOUNTER — Encounter: Payer: Self-pay | Admitting: Emergency Medicine

## 2018-06-28 ENCOUNTER — Other Ambulatory Visit: Payer: Self-pay

## 2018-06-28 DIAGNOSIS — Z79899 Other long term (current) drug therapy: Secondary | ICD-10-CM | POA: Insufficient documentation

## 2018-06-28 DIAGNOSIS — I10 Essential (primary) hypertension: Secondary | ICD-10-CM | POA: Diagnosis not present

## 2018-06-28 DIAGNOSIS — R51 Headache: Secondary | ICD-10-CM | POA: Insufficient documentation

## 2018-06-28 DIAGNOSIS — Z8679 Personal history of other diseases of the circulatory system: Secondary | ICD-10-CM

## 2018-06-28 DIAGNOSIS — R519 Headache, unspecified: Secondary | ICD-10-CM

## 2018-06-28 HISTORY — DX: Vitiligo: L80

## 2018-06-28 LAB — CBC WITH DIFFERENTIAL/PLATELET
Abs Immature Granulocytes: 0.01 10*3/uL (ref 0.00–0.07)
BASOS PCT: 0 %
Basophils Absolute: 0 10*3/uL (ref 0.0–0.1)
EOS PCT: 2 %
Eosinophils Absolute: 0.1 10*3/uL (ref 0.0–0.5)
HEMATOCRIT: 41.3 % (ref 36.0–46.0)
Hemoglobin: 13.8 g/dL (ref 12.0–15.0)
Immature Granulocytes: 0 %
LYMPHS ABS: 1.1 10*3/uL (ref 0.7–4.0)
Lymphocytes Relative: 20 %
MCH: 29.6 pg (ref 26.0–34.0)
MCHC: 33.4 g/dL (ref 30.0–36.0)
MCV: 88.4 fL (ref 80.0–100.0)
MONOS PCT: 6 %
Monocytes Absolute: 0.3 10*3/uL (ref 0.1–1.0)
NEUTROS PCT: 72 %
Neutro Abs: 4.1 10*3/uL (ref 1.7–7.7)
PLATELETS: 266 10*3/uL (ref 150–400)
RBC: 4.67 MIL/uL (ref 3.87–5.11)
RDW: 12.6 % (ref 11.5–15.5)
WBC: 5.7 10*3/uL (ref 4.0–10.5)
nRBC: 0 % (ref 0.0–0.2)

## 2018-06-28 LAB — BASIC METABOLIC PANEL
ANION GAP: 7 (ref 5–15)
BUN: 9 mg/dL (ref 6–20)
CALCIUM: 9.2 mg/dL (ref 8.9–10.3)
CO2: 24 mmol/L (ref 22–32)
CREATININE: 0.72 mg/dL (ref 0.44–1.00)
Chloride: 107 mmol/L (ref 98–111)
GFR calc Af Amer: >60 mL/min (ref >60)
GLUCOSE: 99 mg/dL (ref 70–99)
POTASSIUM: 3.6 mmol/L (ref 3.5–5.1)
Sodium: 138 mmol/L (ref 135–145)

## 2018-06-28 MED ORDER — METOCLOPRAMIDE HCL 10 MG PO TABS: 10.0000 mg | ORAL_TABLET | Freq: Three times a day (TID) | ORAL | 0 refills | Status: DC | PRN

## 2018-06-28 MED ORDER — ACETAMINOPHEN 325 MG PO TABS
650.0000 mg | ORAL_TABLET | Freq: Once | ORAL | Status: AC
  Administered 2018-06-28: 650 mg via ORAL
  Filled 2018-06-28: qty 2

## 2018-06-28 MED ORDER — AMLODIPINE BESYLATE 5 MG PO TABS: 5.0000 mg | ORAL_TABLET | Freq: Every day | ORAL | 2 refills | Status: AC

## 2018-06-28 MED ORDER — METOCLOPRAMIDE HCL 10 MG PO TABS
10.0000 mg | ORAL_TABLET | Freq: Once | ORAL | Status: AC
  Administered 2018-06-28: 10 mg via ORAL
  Filled 2018-06-28: qty 1

## 2018-06-28 MED ORDER — AMLODIPINE BESYLATE 5 MG PO TABS
5.0000 mg | ORAL_TABLET | Freq: Once | ORAL | Status: AC
  Administered 2018-06-28: 5 mg via ORAL
  Filled 2018-06-28: qty 1

## 2018-06-28 NOTE — ED Notes (Signed)
Spoke with Leslie Jones regarding bp = orders are take one more pill once medication is filled, starting tomorrow take 2 pils once per day thru Monday, then after that one pill once per day until follow up with pcp

## 2018-06-28 NOTE — ED Provider Notes (Signed)
[facility] Emergency Department Provider Note ____________________________________________  Time seen: 1013  I have reviewed the triage vital signs and the nursing notes.  HISTORY  Chief Complaint  Headache and Hypertension  HPI Leslie Jones is a 33 y.o. female presents to the ED for evaluation of elevated blood pressure and headache.  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  She was found to be 173/105.  She reports seeing "sparkles" in her eyes and was having a headache at that time.  She reports her headache is primarily over the left side of her forehead.  She presents today with a dull headache without any visual disturbance.  She denies any nausea, vomiting, or dizziness.  She reports improved headache pain today. She is without chest pain, shortness or breath, syncope, or diaphoresis.   Past Medical History:  Diagnosis Date  . Abnormal Pap smear of cervix   . Hypertension   . Vitiligo    There are no active problems to display for this patient.  Past Surgical History:  Procedure Laterality Date  . CESAREAN SECTION      Prior to Admission medications   Medication Sig Start Date End Date Taking? Authorizing Provider  amLODipine (NORVASC) 5 MG tablet Take 1 tablet (5 mg total) by mouth daily. 06/28/18 09/26/18  Renisha Cockrum, Charlesetta Ivory, PA-C  butalbital-acetaminophen-caffeine (FIORICET, ESGIC) 50-325-40 MG tablet Take 1-2 tablets by mouth every 6 (six) hours as needed for headache. 12/12/17 12/12/18  Jene Every, MD  medroxyPROGESTERone (DEPO-SUBQ PROVERA 104) 104 MG/0.65ML injection Inject into the skin.    [provider]  metoCLOPramide (REGLAN) 10 MG tablet Take 1 tablet (10 mg total) by mouth every 8 (eight) hours as needed for nausea or vomiting. 06/28/18   Davell Beckstead, Charlesetta Ivory, PA-C    Allergies Patient has no known allergies.  Family History  Problem Relation Age  of Onset  . Heart failure Maternal Aunt   . Breast cancer Maternal Grandmother   . Seizures Mother   . Ovarian cancer Neg Hx   . Colon cancer Neg Hx     Social History Social History   Tobacco Use  . Smoking status: Never Smoker  . Smokeless tobacco: Never Used  Substance Use Topics  . Alcohol use: No  . Drug use: No    Review of Systems  Constitutional: Negative for fever. Eyes: Negative for visual changes. ENT: Negative for sore throat. Cardiovascular: Negative for chest pain. Respiratory: Negative for shortness of breath. Gastrointestinal: Negative for abdominal pain, vomiting and diarrhea. Genitourinary: Negative for dysuria. Musculoskeletal: Negative for back pain. Skin: Negative for rash. Neurological: Negative for focal weakness or numbness. Reports headache ____________________________________________  PHYSICAL EXAM:  VITAL SIGNS: ED Triage Vitals  Enc Vitals Group     BP 06/28/18 0940 (!) 149/98     Pulse Rate 06/28/18 0940 87     Resp 06/28/18 0940 16     Temp 06/28/18 0940 98.4 F (36.9 C)     Temp Source 06/28/18 0940 Oral     SpO2 06/28/18 0940 99 %     Weight 06/28/18 0941 220 lb (99.8 kg)     Height 06/28/18 0941 5\' 2"  (1.575 m)     Head Circumference --      Peak Flow --      Pain Score 06/28/18 0940 8     Pain Loc --      Pain Edu? --  Excl. in GC? --     Constitutional: Alert and oriented. Well appearing and in no distress. Head: Normocephalic and atraumatic. Eyes: Conjunctivae are normal. PERRL. Normal extraocular movements and fundi bilaterally Ears: Canals clear. TMs intact bilaterally. Nose: No congestion/rhinorrhea/epistaxis. Mouth/Throat: Mucous membranes are moist. Neck: Supple. No thyromegaly. Hematological/Lymphatic/Immunological: No cervical lymphadenopathy. Cardiovascular: Normal rate, regular rhythm. Normal distal pulses. Respiratory: Normal respiratory effort. No wheezes/rales/rhonchi. Gastrointestinal: Soft and  nontender. No distention. Musculoskeletal: Nontender with normal range of motion in all extremities.  Neurologic:  Normal gait without ataxia. Normal speech and language. No gross focal neurologic deficits are appreciated. Skin:  Skin is warm, dry and intact. No rash noted. Psychiatric: Mood and affect are normal. Patient exhibits appropriate insight and judgment. ____________________________________________   LABS (pertinent positives/negatives) Labs Reviewed  BASIC METABOLIC PANEL  CBC WITH DIFFERENTIAL/PLATELET  ____________________________________________  PROCEDURES  Procedures Norvasc 5 mg PO Tylenol 650 mg PO Reglan 10 mg PO ____________________________________________  INITIAL IMPRESSION / ASSESSMENT AND PLAN / ED COURSE  Patient with ED evaluation of headache and elevated blood pressure.  Patient with a medical history of hypertension, without any current medication treatment.  She presents with 2 days of elevated blood pressure and headaches.  Her exam today is benign overall reassuring.  Her labs also without any signs of acute renal failure or renal hypertension.  Patient's blood pressure is improved on initial intake.  She will be started on a daily course of amlodipine for her blood pressure.  She is referred to local community clinic for routine medical care.  She will follow-up as discussed or return to the ED as needed. ____________________________________________  FINAL CLINICAL IMPRESSION(S) / ED DIAGNOSES  Final diagnoses:  Acute nonintractable headache, unspecified headache type  History of uncontrolled hypertension      Mycal Conde, Charlesetta Ivory, PA-C 06/28/18 2016    Jene Every, MD 07/02/18 [phone]

## 2018-06-28 NOTE — ED Notes (Signed)
See triage note  States she developed headache yesterday and noticed that her b/p was elevated    B/p was slightly elevated on arrival  Denies any hx of same  States headache is left temporal area

## 2018-06-28 NOTE — Discharge Instructions (Signed)
Your exam and labs are normal at this time. Take the prescription BP med daily. Take OTC BC Powder, Tylenol, Benadryl and the prescription Reglan, as needed for headache management. Avoid use of OTC anti-inflammatories (NSAIDs) like ibuprofen, naproxen, Advil, Aleve, and Motrin. Follow-up with your  primary provider for routine medical care.

## 2018-06-28 NOTE — ED Triage Notes (Signed)
Pt states yesterday at work her bp was 173/105, was seeing "sparkles" and had a headache. Does not have a bp cuff at home. In triage, bp 149/98, pt states dull headache now, not seeing anything this am, tearful in triage. NAD.

## 2018-09-07 DIAGNOSIS — R87619 Unspecified abnormal cytological findings in specimens from cervix uteri: Secondary | ICD-10-CM | POA: Diagnosis not present

## 2018-09-07 DIAGNOSIS — Z3009 Encounter for other general counseling and advice on contraception: Secondary | ICD-10-CM | POA: Diagnosis not present

## 2018-09-07 DIAGNOSIS — Z30013 Encounter for initial prescription of injectable contraceptive: Secondary | ICD-10-CM | POA: Diagnosis not present

## 2018-10-17 ENCOUNTER — Telehealth: Payer: Self-pay

## 2018-10-17 DIAGNOSIS — Z20822 Contact with and (suspected) exposure to covid-19: Secondary | ICD-10-CM

## 2018-10-17 NOTE — Telephone Encounter (Signed)
Rec'd referral from [facility] re: scheduling pt. For COVID testing, due to poss. Exposure.  Call placed to pt.  Sched. Appt. For 8:15 AM, 7/9, @ [employer] site.  Advised to wear a mask, and remain in car for testing.  Verb. Understanding.

## 2018-10-18 ENCOUNTER — Other Ambulatory Visit: Payer: Medicaid Other

## 2018-10-18 DIAGNOSIS — R6889 Other general symptoms and signs: Secondary | ICD-10-CM | POA: Diagnosis not present

## 2018-10-18 DIAGNOSIS — Z20822 Contact with and (suspected) exposure to covid-19: Secondary | ICD-10-CM

## 2018-10-23 LAB — NOVEL CORONAVIRUS, NAA: SARS-CoV-2, NAA: NOT DETECTED

## 2018-11-30 ENCOUNTER — Ambulatory Visit: Payer: Medicaid Other

## 2018-11-30 ENCOUNTER — Ambulatory Visit (LOCAL_COMMUNITY_HEALTH_CENTER): Payer: Medicaid Other | Admitting: Nurse Practitioner

## 2018-11-30 ENCOUNTER — Other Ambulatory Visit: Payer: Self-pay

## 2018-11-30 VITALS — BP 141/88 | Ht 62.0 in | Wt 233.6 lb

## 2018-11-30 DIAGNOSIS — R87610 Atypical squamous cells of undetermined significance on cytologic smear of cervix (ASC-US): Secondary | ICD-10-CM

## 2018-11-30 DIAGNOSIS — R030 Elevated blood-pressure reading, without diagnosis of hypertension: Secondary | ICD-10-CM

## 2018-11-30 DIAGNOSIS — Z30013 Encounter for initial prescription of injectable contraceptive: Secondary | ICD-10-CM | POA: Diagnosis not present

## 2018-11-30 DIAGNOSIS — Z3009 Encounter for other general counseling and advice on contraception: Secondary | ICD-10-CM

## 2018-11-30 DIAGNOSIS — Z3042 Encounter for surveillance of injectable contraceptive: Secondary | ICD-10-CM

## 2018-11-30 MED ORDER — MEDROXYPROGESTERONE ACETATE 150 MG/ML IM SUSP
150.0000 mg | INTRAMUSCULAR | Status: AC
  Administered 2018-11-30: 150 mg via INTRAMUSCULAR

## 2018-11-30 NOTE — Progress Notes (Signed)
Family Planning Visit- Repeat Yearly Visit  Subjective:  Leslie Jones is a 33 y.o. being seen today for an well woman visit and to discuss family planning options.    She is currently using Depo-Provera injections for pregnancy prevention. Patient reports she does not if she or her partner wants a pregnancy in the next year. Patient  has Essential hypertension and Vitiligo on their problem list.  Chief Complaint  Patient presents with  . Gynecologic Exam    Pap Smear  . Contraception    Depo    Patient reports - no significant past medical history   Patient denies - any concerns or questions at this time   Does the patient desire a pregnancy in the next year? (OK[facility] flowsheet)  See flowsheet for other program required questions.   Body mass index is 42.73 kg/m. - Patient is eligible for diabetes screening based on BMI and age 27>40?  not applicable HA1C ordered? not applicable  Patient reports 1 of partners in last year. Desires STI screening?  No -  - declines at this time  Does the patient have a current or past history of drug use? No   No components found for: HCV]   Health Maintenance Due  Topic Date Due  . HIV Screening  03/07/2002  . PAP SMEAR-Modifier  09/06/2018    Review of Systems  All other systems reviewed and are negative.   The following portions of the patient's history were reviewed and updated as appropriate: allergies, current medications, past family history, past medical history, past social history, past surgical history and problem list. Problem list updated.  Objective:   Vitals:   11/30/18 1416  BP: (!) 141/88  Weight: 233 lb 9.6 oz (106 kg)  Height: 5\' 2"  (1.575 m)    Physical Exam Vitals signs reviewed.  Constitutional:      Appearance: Normal appearance. She is well-developed and normal weight.  Pulmonary:     Effort: Pulmonary effort is normal.  Genitourinary:    General: Normal vulva.     Exam position: Lithotomy position.    Vagina: Normal.     Cervix: Discharge (small amt clear dicharge noted) present. No cervical motion tenderness, friability or erythema.     Uterus: Normal.      Adnexa: Right adnexa normal and left adnexa normal.     Comments: Pap with reflex completed Skin:    General: Skin is warm and dry.  Neurological:     Mental Status: She is alert.  Psychiatric:        Behavior: Behavior is cooperative.       Assessment and Plan:  Leslie SpraySheara Jones is a 33 y.o. female presenting to the [facility] for an initial well woman exam/family planning visit  Contraception counseling: Reviewed all forms of birth control options available including abstinence; over the counter/barrier methods; hormonal contraceptive medication including pill, patch, ring, injection,contraceptive implant; hormonal and nonhormonal IUDs; permanent sterilization options including vasectomy and the various tubal sterilization modalities. Risks and benefits reviewed.  Questions were answered.  Written information was also given to the patient to review.  Patient desires to continue DMPA, this was prescribed for patient. She will follow up in  11-13 wks for surveillance.  She was told to call with any further questions, or with any concerns about this method of contraception.  Emphasized use of condoms 100% of the time for STI prevention.   1. Family planning, Depo-Provera contraception monitoring/administration Client here for DMPA injection Desires  in - Right arm today  - medroxyPROGESTERone (DEPO-PROVERA) injection 150 mg  Denies any additional significant medical history  2. Atypical squamous cells of undetermined significance on cytologic smear of cervix (ASC-US)  Await repeat pap with reflex results - abnormal in 2019 - IGP, rfx Aptima HPV ASCU  3. Elevated BP with diagnosis of hypertension  Advised client to follow up with PCP for elevated BP  Advised client to take prescribed medication as  recommended.   Client verbalizes understanding and is in agreement with plan of care.  Return for 11-13 weeks for DMPA.  No future appointments.  Leslie Andreas, NP

## 2018-11-30 NOTE — Progress Notes (Signed)
Here today for a Pap Smear and Depo. Hal Morales, RN  Depo given and tolerated well. Reminder card given for next Depo with RP if providing physicals at that time. Hal Morales, RN

## 2018-12-04 LAB — IGP, RFX APTIMA HPV ASCU: PAP Smear Comment: 0

## 2018-12-11 ENCOUNTER — Encounter: Payer: Self-pay | Admitting: Family Medicine

## 2018-12-11 DIAGNOSIS — Z8742 Personal history of other diseases of the female genital tract: Secondary | ICD-10-CM | POA: Insufficient documentation

## 2018-12-14 DIAGNOSIS — L219 Seborrheic dermatitis, unspecified: Secondary | ICD-10-CM | POA: Diagnosis not present

## 2018-12-14 DIAGNOSIS — L80 Vitiligo: Secondary | ICD-10-CM | POA: Diagnosis not present

## 2019-02-12 ENCOUNTER — Ambulatory Visit: Payer: Medicaid Other

## 2019-02-12 ENCOUNTER — Other Ambulatory Visit: Payer: Self-pay

## 2019-02-14 ENCOUNTER — Ambulatory Visit (LOCAL_COMMUNITY_HEALTH_CENTER): Payer: Medicaid Other

## 2019-02-14 ENCOUNTER — Other Ambulatory Visit: Payer: Self-pay

## 2019-02-14 VITALS — BP 150/98 | Ht 62.0 in | Wt 236.5 lb

## 2019-02-14 DIAGNOSIS — Z30013 Encounter for initial prescription of injectable contraceptive: Secondary | ICD-10-CM | POA: Diagnosis not present

## 2019-02-14 DIAGNOSIS — Z3009 Encounter for other general counseling and advice on contraception: Secondary | ICD-10-CM

## 2019-02-14 NOTE — Progress Notes (Signed)
Due to elevated BP, consult with Antoine Primas PA and the following verbal orders given: 1) Depo 150 mg IM today as per order written at 11/30/2018 physical and 2) needs BP evaluaton ASAP. Client has Medicaid and states in process of seeking MD in [place] as no longer lives in [place] given.Client tolerated Depo injection without difficulty. Rich Number, RN

## 2019-02-15 NOTE — Progress Notes (Signed)
Consulted by RN re:  Patient with elevated BP and due to get Depo.  OK given to give Depo and rec that patient follow up with PCP or ER if BP stays elevated and has symptoms of headache, chest pain, blurred vision.  Reviewed RN note and agree with documentation.

## 2019-04-11 ENCOUNTER — Emergency Department: Payer: Medicaid Other

## 2019-04-11 ENCOUNTER — Emergency Department
Admission: EM | Admit: 2019-04-11 | Discharge: 2019-04-11 | Disposition: A | Discharge status: Home / Self Care | Payer: Medicaid Other | Attending: Emergency Medicine | Admitting: Emergency Medicine

## 2019-04-11 ENCOUNTER — Other Ambulatory Visit: Payer: Self-pay

## 2019-04-11 DIAGNOSIS — I10 Essential (primary) hypertension: Secondary | ICD-10-CM | POA: Diagnosis not present

## 2019-04-11 DIAGNOSIS — Y939 Activity, unspecified: Secondary | ICD-10-CM | POA: Diagnosis not present

## 2019-04-11 DIAGNOSIS — X58XXXA Exposure to other specified factors, initial encounter: Secondary | ICD-10-CM | POA: Insufficient documentation

## 2019-04-11 DIAGNOSIS — Y929 Unspecified place or not applicable: Secondary | ICD-10-CM | POA: Diagnosis not present

## 2019-04-11 DIAGNOSIS — S29011A Strain of muscle and tendon of front wall of thorax, initial encounter: Secondary | ICD-10-CM | POA: Insufficient documentation

## 2019-04-11 DIAGNOSIS — S29001A Unspecified injury of muscle and tendon of front wall of thorax, initial encounter: Secondary | ICD-10-CM | POA: Diagnosis present

## 2019-04-11 DIAGNOSIS — Y999 Unspecified external cause status: Secondary | ICD-10-CM | POA: Diagnosis not present

## 2019-04-11 DIAGNOSIS — T148XXA Other injury of unspecified body region, initial encounter: Secondary | ICD-10-CM

## 2019-04-11 DIAGNOSIS — Z79899 Other long term (current) drug therapy: Secondary | ICD-10-CM | POA: Diagnosis not present

## 2019-04-11 DIAGNOSIS — R079 Chest pain, unspecified: Secondary | ICD-10-CM | POA: Diagnosis not present

## 2019-04-11 DIAGNOSIS — R0781 Pleurodynia: Secondary | ICD-10-CM | POA: Diagnosis not present

## 2019-04-11 MED ORDER — METHOCARBAMOL 500 MG PO TABS: 500.0000 mg | ORAL_TABLET | Freq: Four times a day (QID) | ORAL | 0 refills | Status: AC | PRN

## 2019-04-11 MED ORDER — NAPROXEN 500 MG PO TABS: 500.0000 mg | ORAL_TABLET | Freq: Two times a day (BID) | ORAL | 0 refills | Status: AC

## 2019-04-11 NOTE — Discharge Instructions (Addendum)
Follow-up with your primary care provider or one of the clinics listed on your discharge papers.  Also the [facility] is available to you.  Keep aware of the salt intake as this will help reduce some of your blood pressure issues if you are eating a lot of salt.  Begin taking medication as directed.  The methocarbamol is not to be taken with alcohol and you cannot drive or operate machinery while taking the medication.  Naproxen 500 mg twice daily with food.  You may use ice or heat to your muscles as needed for discomfort.  There was no fractures noted on your x-ray.

## 2019-04-11 NOTE — ED Notes (Signed)
See triage note  Presents with pain to right lateral rib  Denies any fall  But states she felt a pull when moving something last week

## 2019-04-11 NOTE — ED Provider Notes (Signed)
[facility] Emergency Department Provider Note  ____________________________________________   First MD Initiated Contact with Patient 04/11/19 1003     (approximate)  I have reviewed the triage vital signs and the nursing notes.   HISTORY  Chief Complaint Rib Injury   HPI Leslie Jones is a 33 y.o. female presents to the ED with complaint of left lateral rib pain.  Patient states that she has been moving last week and felt a pulling sensation.  She states that there was no blunt trauma to her ribs.  She denies any difficulty breathing other than when she takes deep breaths it increases her pain.  She has not taken any over-the-counter medication for this.  Pain is also increased with movement.  She rates her pain as 7 out of 10.     Past Medical History:  Diagnosis Date  . Abnormal Pap smear of cervix   . Hypertension   . Vitiligo     Patient Active Problem List   Diagnosis Date Noted  . History of abnormal cervical Pap smear 12/11/2018  . Essential hypertension 02/16/2016  . Vitiligo 03/13/2014    Past Surgical History:  Procedure Laterality Date  . CESAREAN SECTION      Prior to Admission medications   Medication Sig Start Date End Date Taking? Authorizing Provider  amLODipine (NORVASC) 5 MG tablet Take 1 tablet (5 mg total) by mouth daily. 06/28/18 09/26/18  Menshew, Charlesetta Ivory, PA-C  medroxyPROGESTERone (DEPO-SUBQ PROVERA 104) 104 MG/0.65ML injection Inject into the skin.    [provider]  methocarbamol (ROBAXIN) 500 MG tablet Take 1 tablet (500 mg total) by mouth every 6 (six) hours as needed. 04/11/19   Tommi Rumps, PA-C  naproxen (NAPROSYN) 500 MG tablet Take 1 tablet (500 mg total) by mouth 2 (two) times daily with a meal. 04/11/19   Tommi Rumps, PA-C    Allergies Patient has no known allergies.  Family History  Problem Relation Age of Onset  . Heart failure Maternal Aunt   . Breast cancer Maternal  Grandmother   . Seizures Mother   . Hypertension Mother   . Ovarian cancer Neg Hx   . Colon cancer Neg Hx     Social History Social History   Tobacco Use  . Smoking status: Never Smoker  . Smokeless tobacco: Never Used  Substance Use Topics  . Alcohol use: No  . Drug use: No    Review of Systems Constitutional: No fever/chills Cardiovascular: Denies chest pain. Respiratory: Denies shortness of breath. Gastrointestinal: No abdominal pain.  No nausea, no vomiting.  Genitourinary: Negative for dysuria. Musculoskeletal: Positive for right lateral trunk pain. Skin: Negative for rash. Neurological: Negative for headaches, focal weakness or numbness. ____________________________________________   PHYSICAL EXAM:  VITAL SIGNS: ED Triage Vitals  Enc Vitals Group     BP 04/11/19 0904 (!) 157/103     Pulse Rate 04/11/19 0904 85     Resp 04/11/19 0904 16     Temp 04/11/19 0904 98.6 F (37 C)     Temp Source 04/11/19 0904 Oral     SpO2 04/11/19 0904 97 %     Weight 04/11/19 0905 235 lb (106.6 kg)     Height 04/11/19 0905 5\' 2"  (1.575 m)     Head Circumference --      Peak Flow --      Pain Score 04/11/19 0905 7     Pain Loc --      Pain Edu? --  Excl. in [place]? --    Constitutional: Alert and oriented. Well appearing and in no acute distress. Eyes: Conjunctivae are normal.  Head: Atraumatic. Neck: No stridor.   Cardiovascular: Normal rate, regular rhythm. Grossly normal heart sounds.  Good peripheral circulation. Respiratory: Normal respiratory effort.  No retractions. Lungs CTAB. Gastrointestinal: Soft and nontender. No distention.  Musculoskeletal: Right lateral ribs there is no gross deformity however there is tenderness on palpation of the lateral aspect and muscle tissue in this area.  No discoloration is noted.  Range of motion is restricted secondary to discomfort however there is no muscle spasm seen at this time. Neurologic:  Normal speech and language. No gross  focal neurologic deficits are appreciated. No gait instability. Skin:  Skin is warm, dry and intact. No rash noted. Psychiatric: Mood and affect are normal. Speech and behavior are normal.  ____________________________________________   LABS (all labs ordered are listed, but only abnormal results are displayed)  Labs Reviewed - No data to display  RADIOLOGY  Official radiology report(s): DG Ribs Unilateral W/Chest Right  Result Date: 04/11/2019 CLINICAL DATA:  Chest pain after pulling heavy object EXAM: RIGHT RIBS AND CHEST - 3+ VIEW COMPARISON:  None. FINDINGS: Frontal chest as well as oblique and cone-down rib images were obtained. The lungs are clear. The heart size and pulmonary vascularity are normal. No adenopathy. There is no evident pneumothorax or pleural effusion. No evident rib fracture. IMPRESSION: No rib fracture.  Lungs clear. Electronically Signed   By: Lowella Grip III M.D.   On: 04/11/2019 10:23    ____________________________________________   PROCEDURES  Procedure(s) performed (including Critical Care):  Procedures  ____________________________________________   INITIAL IMPRESSION / ASSESSMENT AND PLAN / ED COURSE  As part of my medical decision making, I reviewed the following data within the electronic MEDICAL RECORD NUMBER Notes from prior ED visits and [place] Controlled Substance Database  33 year old female presents to the ED with complaint of right lateral rib pain after moving some things last week.  She denies any blunt trauma to her ribs.  She denies any difficulty breathing or shortness of breath.  X-rays were negative for acute bony injury.  Patient most likely has a muscle strain and she agrees with the treatment plan.  A prescription for methocarbamol 500 mg 1 every 6 hours as needed for muscle spasms and naproxen 500 mg twice daily with food.  She is encouraged to use ice or heat to that area as needed for discomfort.  She is return to the emergency  department if any severe worsening of her symptoms.  ____________________________________________   FINAL CLINICAL IMPRESSION(S) / ED DIAGNOSES  Final diagnoses:  Musculoskeletal strain     ED Discharge Orders         Ordered    methocarbamol (ROBAXIN) 500 MG tablet  Every 6 hours PRN     04/11/19 1059    naproxen (NAPROSYN) 500 MG tablet  2 times daily with meals     04/11/19 1059           Note:  This document was prepared using Dragon voice recognition software and may include unintentional dictation errors.    Johnn Hai, PA-C 04/11/19 1637    Lavonia Drafts, MD 04/12/19 [phone]

## 2019-04-11 NOTE — ED Triage Notes (Signed)
Pt states she was pulling on something last week and has been having right lateral rib pain since, worse with movement.

## 2019-05-01 DIAGNOSIS — Z309 Encounter for contraceptive management, unspecified: Secondary | ICD-10-CM | POA: Diagnosis not present

## 2019-05-01 DIAGNOSIS — I10 Essential (primary) hypertension: Secondary | ICD-10-CM | POA: Diagnosis not present

## 2019-05-01 DIAGNOSIS — Z1389 Encounter for screening for other disorder: Secondary | ICD-10-CM | POA: Diagnosis not present

## 2019-05-01 DIAGNOSIS — E669 Obesity, unspecified: Secondary | ICD-10-CM | POA: Diagnosis not present

## 2019-05-01 DIAGNOSIS — Z3200 Encounter for pregnancy test, result unknown: Secondary | ICD-10-CM | POA: Diagnosis not present

## 2019-06-21 ENCOUNTER — Telehealth: Payer: Self-pay

## 2019-06-21 NOTE — Telephone Encounter (Signed)
TC with patient.  Informed time for repeat pap. Patient would like to do pap when next Depo is due in April.  Instructed to make provider appt in April Richmond Campbell, RN   Attempted TC back to patient.  LM that Depo has expired and she is past 18 weeks; Depo is no longer effective.  Instructed patient to call RN back to schedule appt. Richmond Campbell, RN

## 2019-06-24 NOTE — Telephone Encounter (Signed)
TC with patient. Informed her that we gave her last Depo in November 2020 and that has expired.  Patient states was mistaken when RN talked with her on 06/21/19. She has established care at [facility] and has an appt for Depo in April with them; she will have them complete her pap at that appt also.  Closed to pap f/u at [facility]. Richmond Campbell, RN

## 2019-07-24 DIAGNOSIS — I10 Essential (primary) hypertension: Secondary | ICD-10-CM | POA: Diagnosis not present

## 2019-07-24 DIAGNOSIS — Z309 Encounter for contraceptive management, unspecified: Secondary | ICD-10-CM | POA: Diagnosis not present

## 2019-10-16 DIAGNOSIS — I10 Essential (primary) hypertension: Secondary | ICD-10-CM | POA: Diagnosis not present

## 2019-10-16 DIAGNOSIS — Z309 Encounter for contraceptive management, unspecified: Secondary | ICD-10-CM | POA: Diagnosis not present

## 2020-01-14 DIAGNOSIS — Z309 Encounter for contraceptive management, unspecified: Secondary | ICD-10-CM | POA: Diagnosis not present

## 2020-01-14 DIAGNOSIS — I10 Essential (primary) hypertension: Secondary | ICD-10-CM | POA: Diagnosis not present

## 2020-04-15 DIAGNOSIS — Z309 Encounter for contraceptive management, unspecified: Secondary | ICD-10-CM | POA: Diagnosis not present

## 2020-04-15 DIAGNOSIS — Z3200 Encounter for pregnancy test, result unknown: Secondary | ICD-10-CM | POA: Diagnosis not present

## 2020-07-08 DIAGNOSIS — Z309 Encounter for contraceptive management, unspecified: Secondary | ICD-10-CM | POA: Diagnosis not present

## 2020-09-29 DIAGNOSIS — Z01419 Encounter for gynecological examination (general) (routine) without abnormal findings: Secondary | ICD-10-CM | POA: Diagnosis not present

## 2020-09-29 DIAGNOSIS — Z0000 Encounter for general adult medical examination without abnormal findings: Secondary | ICD-10-CM | POA: Diagnosis not present

## 2020-10-22 DIAGNOSIS — Z6841 Body Mass Index (BMI) 40.0 and over, adult: Secondary | ICD-10-CM | POA: Diagnosis not present

## 2020-10-22 DIAGNOSIS — R87619 Unspecified abnormal cytological findings in specimens from cervix uteri: Secondary | ICD-10-CM | POA: Diagnosis not present

## 2020-10-22 DIAGNOSIS — R635 Abnormal weight gain: Secondary | ICD-10-CM | POA: Diagnosis not present

## 2020-10-22 DIAGNOSIS — G479 Sleep disorder, unspecified: Secondary | ICD-10-CM | POA: Diagnosis not present

## 2020-12-29 DIAGNOSIS — Z309 Encounter for contraceptive management, unspecified: Secondary | ICD-10-CM | POA: Diagnosis not present

## 2020-12-29 DIAGNOSIS — Z0000 Encounter for general adult medical examination without abnormal findings: Secondary | ICD-10-CM | POA: Diagnosis not present

## 2021-03-22 DIAGNOSIS — Z309 Encounter for contraceptive management, unspecified: Secondary | ICD-10-CM | POA: Diagnosis not present

## 2021-03-22 DIAGNOSIS — Z0130 Encounter for examination of blood pressure without abnormal findings: Secondary | ICD-10-CM | POA: Diagnosis not present

## 2021-03-22 DIAGNOSIS — Z0000 Encounter for general adult medical examination without abnormal findings: Secondary | ICD-10-CM | POA: Diagnosis not present

## 2021-04-16 ENCOUNTER — Telehealth: Payer: Self-pay

## 2021-04-16 NOTE — Telephone Encounter (Signed)
.. °  Medicaid Managed Care   Unsuccessful Outreach Note  04/16/2021 Name: Leslie Jones MRN: [id number] DOB: December 25, 1985  Referred by: Department, [facility] Reason for referral : High Risk Managed Medicaid (I called the patient today to get her scheduled with the MM Team.She did not answer and there was not a VM to leave a message.)   An unsuccessful telephone outreach was attempted today. The patient was referred to the case management team for assistance with care management and care coordination.   Follow Up Plan: The care management team will reach out to the patient again over the next 7 days.   Weston Settle Care Guide, High Risk Medicaid Managed Care Embedded Care Coordination [facility]   [facility]

## 2021-04-26 ENCOUNTER — Telehealth: Payer: Self-pay

## 2021-04-26 NOTE — Telephone Encounter (Signed)
.. °  Medicaid Managed Care   Unsuccessful Outreach Note  04/26/2021 Name: Leslie Jones MRN: [id number] DOB: [birth year]  Referred by: Department, [facility] Reason for referral : High Risk Managed Medicaid (I called the patient today to get her scheduled for a phone visit with the MM Team. She did not answer and there was not a VM to leave her a message.)   A second unsuccessful telephone outreach was attempted today. The patient was referred to the case management team for assistance with care management and care coordination.   Follow Up Plan: The care management team will reach out to the patient again over the next 7 days.   Weston Settle Care Guide, High Risk Medicaid Managed Care Embedded Care Coordination [facility]   [facility]

## 2021-06-14 DIAGNOSIS — Z0000 Encounter for general adult medical examination without abnormal findings: Secondary | ICD-10-CM | POA: Diagnosis not present

## 2021-06-14 DIAGNOSIS — Z309 Encounter for contraceptive management, unspecified: Secondary | ICD-10-CM | POA: Diagnosis not present

## 2021-06-14 DIAGNOSIS — Z0130 Encounter for examination of blood pressure without abnormal findings: Secondary | ICD-10-CM | POA: Diagnosis not present

## 2021-08-30 DIAGNOSIS — I10 Essential (primary) hypertension: Secondary | ICD-10-CM | POA: Diagnosis not present

## 2021-08-30 DIAGNOSIS — Z3200 Encounter for pregnancy test, result unknown: Secondary | ICD-10-CM | POA: Diagnosis not present

## 2021-08-30 DIAGNOSIS — Z309 Encounter for contraceptive management, unspecified: Secondary | ICD-10-CM | POA: Diagnosis not present

## 2021-08-30 DIAGNOSIS — Z1389 Encounter for screening for other disorder: Secondary | ICD-10-CM | POA: Diagnosis not present

## 2021-08-30 DIAGNOSIS — Z0131 Encounter for examination of blood pressure with abnormal findings: Secondary | ICD-10-CM | POA: Diagnosis not present

## 2021-11-24 DIAGNOSIS — Z1389 Encounter for screening for other disorder: Secondary | ICD-10-CM | POA: Diagnosis not present

## 2021-11-24 DIAGNOSIS — Z309 Encounter for contraceptive management, unspecified: Secondary | ICD-10-CM | POA: Diagnosis not present

## 2021-11-24 DIAGNOSIS — Z0130 Encounter for examination of blood pressure without abnormal findings: Secondary | ICD-10-CM | POA: Diagnosis not present

## 2021-11-24 DIAGNOSIS — Z0131 Encounter for examination of blood pressure with abnormal findings: Secondary | ICD-10-CM | POA: Diagnosis not present

## 2021-11-24 DIAGNOSIS — I10 Essential (primary) hypertension: Secondary | ICD-10-CM | POA: Diagnosis not present

## 2021-12-25 DIAGNOSIS — Z0131 Encounter for examination of blood pressure with abnormal findings: Secondary | ICD-10-CM | POA: Diagnosis not present

## 2021-12-25 DIAGNOSIS — L80 Vitiligo: Secondary | ICD-10-CM | POA: Diagnosis not present

## 2021-12-25 DIAGNOSIS — E669 Obesity, unspecified: Secondary | ICD-10-CM | POA: Diagnosis not present

## 2021-12-25 DIAGNOSIS — R600 Localized edema: Secondary | ICD-10-CM | POA: Diagnosis not present

## 2021-12-25 DIAGNOSIS — Z1331 Encounter for screening for depression: Secondary | ICD-10-CM | POA: Diagnosis not present

## 2021-12-25 DIAGNOSIS — Z1389 Encounter for screening for other disorder: Secondary | ICD-10-CM | POA: Diagnosis not present

## 2021-12-25 DIAGNOSIS — R5383 Other fatigue: Secondary | ICD-10-CM | POA: Diagnosis not present

## 2021-12-25 DIAGNOSIS — Z131 Encounter for screening for diabetes mellitus: Secondary | ICD-10-CM | POA: Diagnosis not present

## 2021-12-25 DIAGNOSIS — Z0130 Encounter for examination of blood pressure without abnormal findings: Secondary | ICD-10-CM | POA: Diagnosis not present

## 2022-02-17 DIAGNOSIS — Z309 Encounter for contraceptive management, unspecified: Secondary | ICD-10-CM | POA: Diagnosis not present

## 2022-02-17 DIAGNOSIS — Z1389 Encounter for screening for other disorder: Secondary | ICD-10-CM | POA: Diagnosis not present

## 2022-02-17 DIAGNOSIS — Z0131 Encounter for examination of blood pressure with abnormal findings: Secondary | ICD-10-CM | POA: Diagnosis not present

## 2022-05-17 DIAGNOSIS — Z309 Encounter for contraceptive management, unspecified: Secondary | ICD-10-CM | POA: Diagnosis not present

## 2022-08-10 ENCOUNTER — Telehealth: Payer: Self-pay

## 2022-08-10 NOTE — Telephone Encounter (Signed)
..   Medicaid Managed Care   Unsuccessful Outreach Note  08/10/2022 Name: Leslie Jones MRN: [id number] DOB: [birth year]  Referred by: Department, [facility] Reason for referral : Appointment   Third unsuccessful telephone outreach was attempted today. The patient was referred to the case management team for assistance with care management and care coordination. The patient's primary care provider has been notified of our unsuccessful attempts to make or maintain contact with the patient. The care management team is pleased to engage with this patient at any time in the future should he/she be interested in assistance from the care management team.   Follow Up Plan: We have been unable to make contact with the patient for follow up. The care management team is available to follow up with the patient after provider conversation with the patient regarding recommendation for care management engagement and subsequent re-referral to the care management team.   Weston Settle Care Guide  [facility] Managed  Care Guide [facility]  [phone]

## 2022-08-22 DIAGNOSIS — Z3200 Encounter for pregnancy test, result unknown: Secondary | ICD-10-CM | POA: Diagnosis not present

## 2022-08-22 DIAGNOSIS — Z0131 Encounter for examination of blood pressure with abnormal findings: Secondary | ICD-10-CM | POA: Diagnosis not present

## 2022-08-22 DIAGNOSIS — Z1389 Encounter for screening for other disorder: Secondary | ICD-10-CM | POA: Diagnosis not present

## 2022-08-22 DIAGNOSIS — Z309 Encounter for contraceptive management, unspecified: Secondary | ICD-10-CM | POA: Diagnosis not present

## 2022-11-21 DIAGNOSIS — Z1389 Encounter for screening for other disorder: Secondary | ICD-10-CM | POA: Diagnosis not present

## 2022-11-21 DIAGNOSIS — Z309 Encounter for contraceptive management, unspecified: Secondary | ICD-10-CM | POA: Diagnosis not present

## 2022-11-21 DIAGNOSIS — Z0131 Encounter for examination of blood pressure with abnormal findings: Secondary | ICD-10-CM | POA: Diagnosis not present

## 2022-12-27 DIAGNOSIS — Z1389 Encounter for screening for other disorder: Secondary | ICD-10-CM | POA: Diagnosis not present

## 2022-12-27 DIAGNOSIS — Z0130 Encounter for examination of blood pressure without abnormal findings: Secondary | ICD-10-CM | POA: Diagnosis not present

## 2022-12-27 DIAGNOSIS — Z309 Encounter for contraceptive management, unspecified: Secondary | ICD-10-CM | POA: Diagnosis not present

## 2022-12-27 DIAGNOSIS — I10 Essential (primary) hypertension: Secondary | ICD-10-CM | POA: Diagnosis not present

## 2022-12-27 DIAGNOSIS — Z01419 Encounter for gynecological examination (general) (routine) without abnormal findings: Secondary | ICD-10-CM | POA: Diagnosis not present

## 2022-12-27 DIAGNOSIS — Z0000 Encounter for general adult medical examination without abnormal findings: Secondary | ICD-10-CM | POA: Diagnosis not present

## 2022-12-27 DIAGNOSIS — Z113 Encounter for screening for infections with a predominantly sexual mode of transmission: Secondary | ICD-10-CM | POA: Diagnosis not present

## 2023-03-06 DIAGNOSIS — Z3200 Encounter for pregnancy test, result unknown: Secondary | ICD-10-CM | POA: Diagnosis not present

## 2023-03-06 DIAGNOSIS — Z1389 Encounter for screening for other disorder: Secondary | ICD-10-CM | POA: Diagnosis not present

## 2023-03-06 DIAGNOSIS — Z309 Encounter for contraceptive management, unspecified: Secondary | ICD-10-CM | POA: Diagnosis not present

## 2023-03-06 DIAGNOSIS — Z0131 Encounter for examination of blood pressure with abnormal findings: Secondary | ICD-10-CM | POA: Diagnosis not present

## 2023-06-08 DIAGNOSIS — Z0130 Encounter for examination of blood pressure without abnormal findings: Secondary | ICD-10-CM | POA: Diagnosis not present

## 2023-06-08 DIAGNOSIS — Z3200 Encounter for pregnancy test, result unknown: Secondary | ICD-10-CM | POA: Diagnosis not present

## 2023-06-08 DIAGNOSIS — Z309 Encounter for contraceptive management, unspecified: Secondary | ICD-10-CM | POA: Diagnosis not present

## 2023-06-08 DIAGNOSIS — Z0131 Encounter for examination of blood pressure with abnormal findings: Secondary | ICD-10-CM | POA: Diagnosis not present

## 2023-06-08 DIAGNOSIS — Z1389 Encounter for screening for other disorder: Secondary | ICD-10-CM | POA: Diagnosis not present

## 2023-09-06 DIAGNOSIS — I10 Essential (primary) hypertension: Secondary | ICD-10-CM | POA: Diagnosis not present

## 2023-09-06 DIAGNOSIS — Z309 Encounter for contraceptive management, unspecified: Secondary | ICD-10-CM | POA: Diagnosis not present

## 2023-09-06 DIAGNOSIS — Z1389 Encounter for screening for other disorder: Secondary | ICD-10-CM | POA: Diagnosis not present

## 2023-09-06 DIAGNOSIS — Z0131 Encounter for examination of blood pressure with abnormal findings: Secondary | ICD-10-CM | POA: Diagnosis not present

## 2023-12-06 DIAGNOSIS — Z1389 Encounter for screening for other disorder: Secondary | ICD-10-CM | POA: Diagnosis not present

## 2023-12-06 DIAGNOSIS — Z0131 Encounter for examination of blood pressure with abnormal findings: Secondary | ICD-10-CM | POA: Diagnosis not present

## 2023-12-06 DIAGNOSIS — Z309 Encounter for contraceptive management, unspecified: Secondary | ICD-10-CM | POA: Diagnosis not present
